# Patient Record
Sex: Female | Born: 1965 | Race: White | Hispanic: Yes
Health system: Western US, Academic
[De-identification: ages and names within clinical notes are randomized; demographics above are authoritative.]

---

## 2020-02-04 ENCOUNTER — Encounter (INDEPENDENT_AMBULATORY_CARE_PROVIDER_SITE_OTHER): Payer: 59 | Admitting: Optometrist

## 2020-06-01 ENCOUNTER — Ambulatory Visit: Payer: 59 | Attending: "Endocrinology | Admitting: "Endocrinology

## 2020-06-01 ENCOUNTER — Encounter (HOSPITAL_BASED_OUTPATIENT_CLINIC_OR_DEPARTMENT_OTHER): Payer: Self-pay | Admitting: "Endocrinology

## 2020-06-01 VITALS — BP 152/76 | HR 75 | Temp 97.2°F | Wt 126.8 lb

## 2020-06-01 DIAGNOSIS — N3090 Cystitis, unspecified without hematuria: Secondary | ICD-10-CM | POA: Insufficient documentation

## 2020-06-01 DIAGNOSIS — E1169 Type 2 diabetes mellitus with other specified complication: Secondary | ICD-10-CM | POA: Insufficient documentation

## 2020-06-01 DIAGNOSIS — E119 Type 2 diabetes mellitus without complications: Secondary | ICD-10-CM | POA: Insufficient documentation

## 2020-06-01 DIAGNOSIS — I10 Essential (primary) hypertension: Secondary | ICD-10-CM | POA: Insufficient documentation

## 2020-06-01 DIAGNOSIS — Z794 Long term (current) use of insulin: Secondary | ICD-10-CM | POA: Insufficient documentation

## 2020-06-01 LAB — GLUCOSE POC, HMC: Glucose (POC): 294 mg/dL — ABNORMAL HIGH (ref 62–125)

## 2020-06-01 MED ORDER — HUMULIN 70/30 KWIKPEN (70-30) 100 UNIT/ML SC SUPN
15.0000 [IU] | PEN_INJECTOR | Freq: Two times a day (BID) | SUBCUTANEOUS | 3 refills | Status: AC
Start: 2020-06-01 — End: ?

## 2020-06-01 MED ORDER — GLUCOSE BLOOD VI STRP
1.0000 | ORAL_STRIP | Freq: Two times a day (BID) | 3 refills | Status: AC
Start: 2020-06-01 — End: ?

## 2020-06-01 NOTE — Progress Notes (Signed)
DIABETES NEW PATIENT ASSESSMENT    HPI: Elizabeth Elliott is a 54 year old female with Type 2 diabetes mellitus. She is referred by Johnell Comings, PA-C for evaluation and management of diabetes. I did receive copies of outside records, which I have reviewed.     Type 2 diabetes mellitus:  Diagnosed in 2010. She has been on insulin for the last 5 years or so. She has been on metformin since the beginning of her diagnosis. She tolerates it well.  After her admission to the hospital in June, she had been doing well. She reports having abdominal pain that has been happening for 1 week. It happens in her lower central abdomen, is intense in nature, lasts for about 15 min at a time and then subsides spontaneously. It is not related to urination or eating. No fever, chills, painful urination, change in color of urine or blood in urine. BMs are regular.  No tingling, numbness, vision changes, nausea, bloating. No chest pain, SOB, palpitations.    Diet:  She eats scrambled eggs, 2 toast for breakfast. Lunch is chicken/meat with 2 tortillas and rice. Dinner is beans, cheese, 2 tortillas. Snacks are usually fruit and are occasional (banana, pear).  Activity:  She needs to be on her feet a lot for work. She does not walk or do any activity outside of work except for 1-2 times per week.  Weight:  She feels she has lost ~10 lbs over the last 8 months. She was 135 lbs in Jan 2021.    Daily routine (7 days a week): Wakes up at 2.30 am. Has coffee at 3 am. Has breakfast at 8 am. She works from 4 am to 11.30 am. She then has lunch at home. Sleeps from 2-4 pm. Then eats dinner. Then sleeps from 8 pm to 2.30 am.     Current medication regimen includes:  Metformin 1 g BID  Detemir 10 units BID - more likely to forget this  Novolog TID QAC as correction only 2:50>150  She used to be on a cholesterol lowering (simvastatin ?dose) and BP lowering medication (thinks it was lisinopril 10 mg daily) but she has been off these for the  last month.    Previous treatments have included: none. She has not met with a nutritionist in the past. Diabetes complications include peripheral neuropathy.  She does not have hypoglycemia unawareness.     Other issues include:  - HTN  - Prior sepsis from ESBL cystitis  - Prior frequent UTIs    Complete ROS negative other than as in HPI above.    Blood glucose meter data:   No meter today. CBG in clinic 294.    Diabetes Prevention:  Renal  is not on ACE/ARB  Ophthalmologic  Last eye exam 5 months ago, ?normal  CV  is not on statin, is not on aspirin  Podiatry  Last foot exam today as below    ALLERGIES:  Reviewed and confirmed    MEDICATIONS:  See above    PAST MEDICAL HISTORY:  Patient Active Problem List   Diagnosis    Type 2 diabetes mellitus without complication, with long-term current use of insulin    Essential hypertension    Cystitis       FAMILY HISTORY:     Family History     Problem (# of Occurrences) Relation (Name,Age of Onset)    Cancer (1) Brother: Colon       Negative family history of: Diabetes, Heart  Disease, Hypertension, Stroke        SOCIAL HISTORY:  Social History     Socioeconomic History    Marital status: Not on file     Spouse name: Not on file    Number of children: Not on file    Years of education: Not on file    Highest education level: Not on file   Occupational History    Not on file   Social Needs    Financial resource strain: Not on file    Food insecurity     Worry: Not on file     Inability: Not on file    Transportation needs     Medical: Not on file     Non-medical: Not on file   Tobacco Use    Smoking status: Never Smoker   Substance and Sexual Activity    Alcohol use: Never     Frequency: Never    Drug use: Never    Sexual activity: Not on file   Lifestyle    Physical activity     Days per week: Not on file     Minutes per session: Not on file    Stress: Not on file   Relationships    Social connections     Talks on phone: Not on file     Gets together: Not  on file     Attends religious service: Not on file     Active member of club or organization: Not on file     Attends meetings of clubs or organizations: Not on file     Relationship status: Not on file    Intimate partner violence     Fear of current or ex partner: Not on file     Emotionally abused: Not on file     Physically abused: Not on file     Forced sexual activity: Not on file   Other Topics Concern    Not on file   Social History Narrative    Moved here from [place] in 06/2019. Works in a Copywriter, advertising. Lives in [place] with partner. She has 2 children who live in [place].     OBGYN:  Menopause at age 49 naturally. Has 2 children.    EXAM:  VS: BP (!) 152/76    Pulse 75    Temp 36.2 C (Temporal)    Wt 57.5 kg (126 lb 12.8 oz)    SpO2 97%   GEN: pleasant, cooperative in no apparent distress  EYES: normal extra-ocular movements, no lid lag, no stare  ENMT: normal thyroid exam  RESP: breathing unlabored  CVS:  rate: normal , rhythm: regular , pedal pulses 2+, peripheral edema: absent   ABDO: mild central obesity  CNS: normal gait  SKIN: No dryness, injection sites without redness or induration, no lipohypertrophy   PSYCH:  normal affect, AOx3  FOOT EXAM: normal, with intact sensation to 10 g monofilament and vibration, no ulcers or lesions, nails with onychomycosis    DATA REVIEW:              ASSESSMENT/PLAN:  Type 2 diabetes:  Elizabeth Elliott has had poorly controlled type 2 DM for a long time. The labs visible to me suggest A1c >10% for all of 2021. She also has signs of relative insulin deficiency with her weight loss and even ketoacidosis recently. Therefore, at this point, we really need to get her glucose down with insulin therapy. Given her main issue has been compliance  with multiple insulin injections per day, that is the first area we targeted today. Given her rigorous daily schedule, we discussed that simplifying her regimen to improve adherence. Her meals are consistent and so it would be  better to use NPH/Lispro 70/30 mixed insulin first, since this will provide both basal and prandial coverage and will be an easier regimen for her to adhere to at 2 doses a day. We discussed the importance of checking BG regularly and bringing meter to her future appointments. Down the road, when she is better controlled, we can consider adding Liraglutide to her regimen (likely only GLP-1Ra covered by her health plan). I would wait to add SGLT-2i (if covered by her insurance) till I can see stability without UTIs.  - stop levemir and lispro insulins  - start NPH 70/30 mixed insulin 15 units BID (before breakfast and before dinner)  - check BG 2 times daily (before breakfast and before dinner)  - bring meter to next appointment    Patient was advised it is appropriate to adjust insulin dosing by 30% as needed to maintain glycemic control.    Risk for NAFLD:  NFS: -0.461  FIB-4: 0.841  BARD: 3  Abdominal imaging: normal appearance of liver on CT in 02/2020  Will monitor with annual CMP and CBC.    Abdominal pain:  Unclear etiology. Not concerning at this time for UTI given absence of dysuria, fever, chills. Will revisit at next visit and if still persistent, will consider UA.    Spent 60 minutes in patient care including time spent with patient discussing issues outlined above, care coordination, data/chart review and documentation.    F/u in 2 weeks.

## 2020-06-14 NOTE — Progress Notes (Signed)
[facility] Follow Up Note    HPI:   Elizabeth Elliott is a 54 year old female with Type 2 diabetes mellitus; she is here today for follow up. Diabetes was diagnosed in 2010.    She reports good adherence to her insulin since the last visit. She reports no symptoms of shaking or sweating or feeling like a low glucose. However, she feels a headache when her BG is down to 120.  She still eats 3 meals a day. Not skipping any meals.  No tingling, numbness, vision changes, nausea, bloating, abdominal pain, diarrhea, conspitation. No chest pain, SOB, palpitations.    In the last week (Fri-Sun), she had symptoms of chills, feeling cold and body ache. She also felt feverish but didn't check temp. No sore throat. No nasal congestion or runny nose. No loss of taste or sense of smell. No contact with anyone with COVID-19. She did not get tested. She is full vaccinated against COVID. She felt fine Monday onwards. She never had issues with dysuria during this.     Current medication regimen includes:  Metformin 1 g BID - but only taking QHS  NPH 70/30 15 units BID  She used to be on a cholesterol lowering (simvastatin ?dose) and BP lowering medication (thinks it was lisinopril 10 mg daily) but she has been off these for the last month.    Previous treatments have included: none. She has not met with a nutritionist in the past. Diabetes complications include peripheral neuropathy.  She does not have hypoglycemia unawareness.     Other issues include:  - HTN  - Prior sepsis from ESBL cystitis  - Prior frequent UTIs  - Risk for NAFLD: NFS: -0.461 (indeterminate), FIB-4: 0.841 (low risk), BARD: 3; Abdominal imaging: normal appearance of liver on CT in 02/2020. Will monitor with annual CMP and CBC.    Complete ROS is negative other than as in HPI above. Past, family and social history were reviewed and updated as needed.    Blood glucose meter data:   Average 257  Checks capillary blood glucose (CBG) readings 1 per  day.  Download interpretation:   Fasting range: 119-319  Pre-dinner range: 213-386    Diabetes Prevention:  Renal  is not on ACE/ARB  Ophthalmologic  Last eye exam 5 months ago, ?normal  CV  is not on statin, is not on aspirin  Podiatry  Last foot exam today 05/2020    PROBLEM LIST:  Patient Active Problem List   Diagnosis   . Type 2 diabetes mellitus without complication, with long-term current use of insulin   . Essential hypertension   . Cystitis       ALLERGIES:  Review of patient's allergies indicates:  No Known Allergies    MEDICATIONS:  Current Outpatient Medications   Medication Sig Dispense Refill   . blood glucose test strip 1 strip 2 times a day. Use to check blood sugar. 100 strip 3   . insulin ASPART 100 UNIT/ML pen-injector Inject under the skin. Inject TID QAC as 2:50>150     . insulin DETEMIR 100 UNIT/ML pen-injector Inject 10 Units under the skin 2 times a day.     . insulin NPH & REGULAR (HumuLIN 70/30 KwikPen) (70-30) 100 UNIT/ML pen-injector Inject 15 Units under the skin 2 times a day before meals. 3 each 3   . metFORMIN 1000 MG tablet Take 1 tablet by mouth 2 times a day.       No current facility-administered  medications for this visit.        EXAM:  Vitals:    06/15/20 1125   Temp: 36.4 C   Pulse: 80   BP: (!) 142/77   SpO2: 98%   Weight: 58.1 kg (128 lb 1.6 oz)     GEN: pleasant, cooperative in no apparent distress  Skin: no lipohypertrophy  Neuro: AAOX3    DATA REVIEW:  None since last visit    ASSESSMENT/PLAN:  Type 2 diabetes:  [name] has been very compliant with her insulin though had been taking only 1 tab of metformin daily. I recommended that she take the metformin 2 times daily instead. In terms of her insulin, it appears that her fasting glucose are reasonable on many days. Therefore, the 15 units at dinner is likely working well. However, the pre-dinner BG is usually very high. Therefore, we discussed increasing her AM NPH 70/30 dose today. She is up to date with measures.  -  increase NPH 70/30 to 25 units in AM and keep at 15 units with dinner  - take metformin 1 g BID as prescribed  - return to see Pharm D in 2 weeks    Fever/chills:  She reports having some feverish symptoms and chills over this past weekend. No symptoms of dysuria but given her prior history, it would be worthwhile to check UA now. She also had no exposure to COVID, but still worthwhile to rule this out, to determine need to isolate. She is no longer symptomatic today and hasn't had symptoms for 3 days now but had symptoms within the last week.  - check COVID PCR  - check UA    Spent 30 minutes in patient care including time spent with patient discussing issues outlined above, care coordination, data/chart review and documentation.    F/u in 3 months.

## 2020-06-15 ENCOUNTER — Other Ambulatory Visit (HOSPITAL_BASED_OUTPATIENT_CLINIC_OR_DEPARTMENT_OTHER): Payer: Self-pay | Admitting: "Endocrinology

## 2020-06-15 ENCOUNTER — Encounter (HOSPITAL_BASED_OUTPATIENT_CLINIC_OR_DEPARTMENT_OTHER): Payer: Self-pay | Admitting: "Endocrinology

## 2020-06-15 ENCOUNTER — Ambulatory Visit: Payer: 59 | Attending: "Endocrinology | Admitting: "Endocrinology

## 2020-06-15 VITALS — BP 142/77 | HR 80 | Temp 97.5°F | Wt 128.1 lb

## 2020-06-15 DIAGNOSIS — N390 Urinary tract infection, site not specified: Secondary | ICD-10-CM

## 2020-06-15 DIAGNOSIS — R509 Fever, unspecified: Secondary | ICD-10-CM | POA: Insufficient documentation

## 2020-06-15 DIAGNOSIS — Z794 Long term (current) use of insulin: Secondary | ICD-10-CM

## 2020-06-15 DIAGNOSIS — E119 Type 2 diabetes mellitus without complications: Secondary | ICD-10-CM

## 2020-06-15 LAB — URINALYSIS WITH REFLEX CULTURE
Bilirubin (Qual), URN: NEGATIVE
Epith Cells_Renal/Trans,URN: NEGATIVE /HPF
Ketones, URN: NEGATIVE mg/dL
Leukocyte Esterase, URN: POSITIVE — AB
Nitrite, URN: NEGATIVE
Specific Gravity, URN: 1.022 g/mL (ref 1.006–1.027)
pH, URN: 6 (ref 5.0–8.0)

## 2020-06-15 LAB — REFLEX CULTURE FOR UA

## 2020-06-15 NOTE — Patient Instructions (Signed)
Aumentar la insulina a 25 unidades por la Diannia Ruder y mantener 15 unidades por la noche.

## 2020-06-19 LAB — URINE C/S
Culture: 11000 — AB
Culture: >100000 — AB

## 2020-06-21 ENCOUNTER — Telehealth (HOSPITAL_BASED_OUTPATIENT_CLINIC_OR_DEPARTMENT_OTHER): Payer: Self-pay | Admitting: Nursing

## 2020-06-21 MED ORDER — SULFAMETHOXAZOLE-TRIMETHOPRIM 800-160 MG OR TABS
1.0000 | ORAL_TABLET | Freq: Two times a day (BID) | ORAL | 0 refills | Status: AC
Start: 2020-06-21 — End: 2020-06-24

## 2020-06-21 NOTE — Progress Notes (Signed)
Sent prescription of bactrim DS 1 tab BID for 3 days to [facility].

## 2020-06-21 NOTE — Telephone Encounter (Signed)
Left a message for the pt via spanish interpreter relaying the provider message about the course of antibiotics.

## 2020-06-21 NOTE — Telephone Encounter (Signed)
-----   Message from Cristine Polio, MD sent at 06/19/2020  9:59 PM PDT -----  Efraim Kaufmann,    I want to prescribe her bactrim 1 DS tablet twice daily for 3 days. She has a UTI and her cutlure suggests the organism is susceptible to this.  She reported to me that her symptoms had mostly resolved but given her prior history of UTIs/ sepsis recently, and current CFU, I want to treat. If her symptoms are actually worse now, then I think she should take it for 14 days and also reach out to her PCP. Can you call her Monday?    Thanks!  Arthi  ----- Message -----  From: Christain [place] Results Interface, User  Sent: 06/15/2020   1:12 PM PDT  To: Cristine Polio, MD

## 2020-06-22 ENCOUNTER — Telehealth (HOSPITAL_BASED_OUTPATIENT_CLINIC_OR_DEPARTMENT_OTHER): Payer: Self-pay | Admitting: Nursing

## 2020-06-22 NOTE — Telephone Encounter (Addendum)
Called and relayed the message to the pt. She will pick up the Rx today.     ----- Message from Cristine Polio, MD sent at 06/21/2020  1:04 PM PDT -----  Sent it over. Can you try to reach her once tomorrow?    Thanks!  Arthi  ----- Message -----  From: Edger House., RN  Sent: 06/21/2020  12:36 PM PDT  To: Cristine Polio, MD    Arthi,     We were not able to reach the pt. Did you want to send a prescription for the antibiotic to the pharmacy.     Thanks,   Leam Madero   ----- Message -----  From: Cristine Polio, MD  Sent: 06/19/2020   9:59 PM PDT  To: Edger House, RN    Aston Lieske,    I want to prescribe her bactrim 1 DS tablet twice daily for 3 days. She has a UTI and her cutlure suggests the organism is susceptible to this.  She reported to me that her symptoms had mostly resolved but given her prior history of UTIs/ sepsis recently, and current CFU, I want to treat. If her symptoms are actually worse now, then I think she should take it for 14 days and also reach out to her PCP. Can you call her Monday?    Thanks!  Arthi  ----- Message -----  From: Christain Sacramento Results Interface, User  Sent: 06/15/2020   1:12 PM PDT  To: Cristine Polio, MD

## 2020-07-06 ENCOUNTER — Ambulatory Visit (HOSPITAL_BASED_OUTPATIENT_CLINIC_OR_DEPARTMENT_OTHER): Payer: 59

## 2020-07-22 ENCOUNTER — Other Ambulatory Visit (HOSPITAL_BASED_OUTPATIENT_CLINIC_OR_DEPARTMENT_OTHER): Payer: Self-pay | Admitting: Unknown Physician Specialty

## 2020-07-22 DIAGNOSIS — E119 Type 2 diabetes mellitus without complications: Secondary | ICD-10-CM

## 2020-07-22 DIAGNOSIS — Z794 Long term (current) use of insulin: Secondary | ICD-10-CM

## 2020-08-03 ENCOUNTER — Encounter (HOSPITAL_BASED_OUTPATIENT_CLINIC_OR_DEPARTMENT_OTHER): Payer: 59

## 2020-08-03 NOTE — Progress Notes (Deleted)
Referring Provider: Cristine Polio, MD   Referral Date: 06/15/20  Interpreter Services: A  Spanish speaking interpreter was present for the visit  by telephone.     SUBJECTIVE  CC: Elizabeth Elliott is a 54 year old year old female seeing the pharmacist for follow-up of type 2 diabetes.     Diabetes History: Type 2 diabetes diagnosed 2010. Started seeing [facility] (Dr. Rogers Seeds) on 06/01/20.  Complications: peripheral neuropathy  Pertinent PMH: HTN, prior h/o frequent UTIs and prior sepsis from ESBL cystitis    Interval History: Last seen by Dr. Rogers Seeds 6 weeks ago after changing from basal-bolus to premixed insulin (Humulin N 70/30). Her AM dose of 70/30 insulin was increased.     Today, patient reports ***    *** s/sx of hypoglycemia (dizziness, sweating, shakiness)  *** s/sx of hyperglycemia (polyuria, polydipsia, fatigue, blurry vision, peripheral neuropathy)    Current diabetes regimen:   Metformin 1000 mg BID  Humulin N 70/30 (pen) 25 units with breakfast and 15 units with dinner     Adherence: ***    Lifestyle:  Daily routine (7 days a week): Wakes up at 2.30 am. Has coffee at 3 am. Has breakfast at 8 am. She works from 4 am to 11.30 am. She then has lunch at home. Sleeps from 2-4 pm. Then eats dinner. Then sleeps from 8 pm to 2.30 am.     Diet:***   Breakfast: scrambled eggs, 2 toast   Lunch: chicken/meat with 2 tortillas and rice.    Dinner: beans, cheese, 2 tortillas. Snacks are usually fruit and are occasional (banana, pear).  -Exercise:  active/walking a lot during work, no exercise otherise  -Other: ***    OBJECTIVE  Diabetes Information:  Home Glucometer Readings: {;:106524}    There were no vitals filed for this visit.      BP Readings from Last 3 Encounters:   06/15/20 (!) 142/77   06/01/20 (!) 152/76       Wt Readings from Last 5 Encounters:   06/15/20 58.1 kg (128 lb 1.6 oz)   06/01/20 57.5 kg (126 lb 12.8 oz)       Labs:            ASSESSMENT/PLAN  There are no diagnoses linked to this  encounter.    Follow up: ***    EDUCATION  -Primary learner(s): {;:106526}  -Challenges impacting this teaching session: {;:017494}  -Education materials provided: ***  -Post education response: {;:106530}    Medication  Medication education discussion topics included: {;:106535}    Diabetes  Diabetes discussion topics included:{;:106531}  Glucometer education: {;:496759}    Nutrition  Nutritional discussion topics included: {;:106533}    Exercise  Exercise discussion topics included: {;:106534}    MEDICATIONS AT END OF VISIT  Current Outpatient Medications   Medication Sig Dispense Refill    blood glucose test strip 1 strip 2 times a day. Use to check blood sugar. 100 strip 3    insulin NPH & REGULAR (HumuLIN 70/30 KwikPen) (70-30) 100 UNIT/ML pen-injector Inject 15 Units under the skin 2 times a day before meals. 3 each 3    metFORMIN 1000 MG tablet Take 1 tablet by mouth 2 times a day.       No current facility-administered medications for this visit.        TIME SPENT  9:52 AM   {Please include time spent statement - .totaltimeoffice}    Wadie Lessen, PharmD

## 2020-09-13 ENCOUNTER — Emergency Department: Payer: Self-pay

## 2020-09-14 ENCOUNTER — Inpatient Hospital Stay: Payer: Self-pay

## 2020-09-27 ENCOUNTER — Telehealth (HOSPITAL_BASED_OUTPATIENT_CLINIC_OR_DEPARTMENT_OTHER): Payer: Self-pay | Admitting: Ophthalmology

## 2020-09-27 NOTE — Telephone Encounter (Signed)
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    Thank you

## 2020-09-27 NOTE — Progress Notes (Deleted)
[facility] Follow Up Note    HPI:   Elizabeth Elliott is a 54 year old female with Type 2 diabetes mellitus; she is here today for follow up. Diabetes was diagnosed in 2010.    Current medication regimen includes:  Metformin 1 g BID   NPH 70/30 25 units QAM and 15 units QPM  She used to be on a cholesterol lowering (simvastatin ?dose) and BP lowering medication (thinks it was lisinopril 10 mg daily) but she has been off these for the last month.    Previous treatments have included:none. Shehas notmet with a nutritionist in the past. Diabetes complications include peripheral neuropathy. She does nothave hypoglycemia unawareness.     Other issues include:  - HTN  - Prior sepsis from ESBL cystitis  - Prior frequent UTIs  - Risk for NAFLD: NFS:-0.461 (indeterminate), FIB-4:0.841 (low risk), BARD:3; Abdominal imaging:normal appearance of liver on CT in 02/2020. Will monitor with annual CMP and CBC.    Complete ROS is negative other than as in HPI above. Past, family and social history were reviewed and updated as needed.    Blood glucose meter data:   Average ***, SD ***  Checks capillary blood glucose (CBG) readings *** per day.  Download interpretation: ***    Diabetes Prevention:  Renalis noton ACE/ARB  OphthalmologicLast eye exam5 months ago, ?normal  CVis noton statin, is noton aspirin  PodiatryLast foot examtoday 05/2020    PROBLEM LIST:  Patient Active Problem List   Diagnosis    Type 2 diabetes mellitus without complication, with long-term current use of insulin    Essential hypertension    Cystitis       ALLERGIES:  Review of patient's allergies indicates:  No Known Allergies    MEDICATIONS:  Current Outpatient Medications   Medication Sig Dispense Refill    blood glucose test strip 1 strip 2 times a day. Use to check blood sugar. 100 strip 3    insulin NPH & REGULAR (HumuLIN 70/30 KwikPen) (70-30) 100 UNIT/ML pen-injector Inject 15 Units under the skin 2 times a day  before meals. 3 each 3    metFORMIN 1000 MG tablet Take 1 tablet by mouth 2 times a day.       No current facility-administered medications for this visit.       EXAM:  VS: There were no vitals taken for this visit.  GEN: pleasant, cooperative in no apparent distress  EYES: normal extra-ocular movements, no lid lag, no stare  ENMT: mucous membranes moist, normal dentition  RESP: No wheeze, breathing unlabored  CVS:  rate: normal , rhythm: regular , pedal pulses 2+, peripheral edema: absent   ABDO: {Yes No (Default) :109033::"No"} central obesity  CNS: normal gait  SKIN: No dryness, injection sites without redness or induration, no lipohypertrophy   PSYCH:  normal affect, AOx3  FOOT EXAM: {DIABETIC FOOT EXAM:100689::"normal, with intact sensation to 10 g monofilament and vibration, no ulcers or lesions"}, nails {NORMAL/OTHER:103686::"with onychomycosis"}    DATA REVIEW:    ASSESSMENT/PLAN:      Spent *** minutes in patient care including time spent with patient discussing issues outlined above, care coordination, data/chart review and documentation.    F/u in *** months.

## 2020-09-28 ENCOUNTER — Encounter (HOSPITAL_BASED_OUTPATIENT_CLINIC_OR_DEPARTMENT_OTHER): Payer: 59 | Admitting: "Endocrinology

## 2021-02-05 ENCOUNTER — Emergency Department: Payer: Self-pay

## 2021-03-07 ENCOUNTER — Encounter (HOSPITAL_BASED_OUTPATIENT_CLINIC_OR_DEPARTMENT_OTHER): Payer: Self-pay | Admitting: Infectious Disease

## 2021-03-07 ENCOUNTER — Ambulatory Visit: Payer: 59 | Attending: Internal Medicine | Admitting: Infectious Disease

## 2021-03-07 VITALS — BP 155/45 | HR 66 | Temp 97.7°F | Resp 18 | Wt 127.0 lb

## 2021-03-07 DIAGNOSIS — R310 Gross hematuria: Secondary | ICD-10-CM | POA: Insufficient documentation

## 2021-03-07 DIAGNOSIS — N390 Urinary tract infection, site not specified: Secondary | ICD-10-CM | POA: Insufficient documentation

## 2021-03-07 MED ORDER — PREMARIN 0.625 MG/GM VA CREA
0.5000 g | TOPICAL_CREAM | VAGINAL | 1 refills | Status: AC
Start: 2021-03-09 — End: ?

## 2021-03-07 NOTE — Progress Notes (Signed)
INFECTIOUS DISEASE CLINIC   INITIAL VISIT  03/07/2021     REFERRING PROVIDER : Leta Speller, PA-C    ID/CC: Elizabeth Elliott is a 55 year old female with a history of T2DM who presents today for recurrent ESBL E coli UTI.    HISTORY OF PRESENT ILLNESS:    Patient has a history of ESBL E coli UTI and bacteremia over the past year. She had a previous hospitalization in 08/2020 for pyelonephritis secondary to ESBL E coli. More recently, she presented to OSH ED on 02/05/2021 with a 2 week history of intermittent left flank pain radiating to her entire abdomen. She also reported dysuria x 1 week and nausea. Labs notable for WBC 11.4. UA with 2+ LE, 70 WBC. Urine culture grew ESBL E coli. CT scan showed evidence of pyelonephritis. She was prescribed a 10 day course of PO bactrim DS single tablet daily, though patient reports taking 2 tablets daily, one in the morning and one in the evening.    Patient was referred to ID clinic for her history of recurrent UTI. Today, she denies any dysuria, abdominal pain, or flank pain. She does endorse intermittent hematuria starting 4/26 that lasts for 1-2 days at a time and self-resolves. Patient otherwise does not have any current symptoms. Denies fevers, chills, nausea, vomiting, diarrhea, or rash.      REVIEW OF SYSTEMS:  I performed a complete review of systems today and have documented the patient's positive and pertinent negative complaints above. All remaining systems are negative.      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  From [place]moved over 20 years ago). Used to live in [place], moved to [place] 2 years ago.   No history of tuberculosis, no known exposures  Sexually active  Post-menopausal    FAMILY HISTORY:  No family history of recurrent infections    PHYSICAL EXAMINATION:    Vitals: Blood pressure (!) 155/45, pulse 66, temperature 36.5 C, temperature source Temporal, resp. rate 18, weight 57.6 kg (127 lb).   Gen: Comfortable, in NAD.  Eyes: eyelids normal, conjunctiva clear, pupils equal.  No scleral icterus.  ENMT: normal dentition, oropharynx normal, moist mucous membranes  Lungs: CTAB, nl resp effort. No wheezes/crackles/rhonchi  Heart: CV regular rate and rhythm. No murmurs.   Abd: +BS, NT, ND  MSK: No edema. No swollen joints. Normal range of motion in extremities   Skin: No rashes, warm and dry.   Neuro: A&Ox3, CN II-XII intact, no focal deficits  Psych: normal mood, appropriate affect  Lines: none        LABORATORY DATA:  I have reviewed the following lab data:  Lab Results   Component Value Date    GLUCOSE 294 (H) 06/01/2020       MICROBIOLOGY:  I have reviewed the following micro data:    OSH  02/05/2021 Urine culture >100,000 E coli ESBL  Organism Antibiotic Susceptibility   **Escherichia coli Positive for ESBL** Ampicillin >=32: Resistant   **Escherichia coli Positive for ESBL** Ampicillin + Sulbactam >=32: Resistant   **Escherichia coli Positive for ESBL** Cefazolin >=64: Resistant   **Escherichia coli Positive for ESBL** Ciprofloxacin >=4: Resistant   **Escherichia coli Positive for ESBL** ESBL Positive   **Escherichia coli Positive for ESBL** Gentamicin >=  16: Resistant   **Escherichia coli Positive for ESBL** Levofloxacin >=8: Resistant   **Escherichia coli Positive for ESBL** Meropenem <=0.25: Sensitive   **Escherichia coli Positive for ESBL** Nitrofurantoin <=16: Sensitive   **Escherichia coli Positive for ESBL** Tobramycin >=16: Resistant   **Escherichia coli Positive for ESBL** Trimethoprim/Sulfa <=20: Sensitive     09/14/2020 Blood culture E coli ESBL  **Escherichia coli Positive for ESBL** Ampicillin >=32: Resistant   **Escherichia coli Positive for ESBL** Ampicillin + Sulbactam >=32: Resistant   **Escherichia coli Positive for ESBL** Ciprofloxacin >=4: Resistant    **Escherichia coli Positive for ESBL** ESBL Positive   **Escherichia coli Positive for ESBL** Gentamicin >=16: Resistant   **Escherichia coli Positive for ESBL** Levofloxacin >=8: Resistant   **Escherichia coli Positive for ESBL** Meropenem <=0.25: Sensitive   **Escherichia coli Positive for ESBL** Tobramycin >=16: Resistant   **Escherichia coli Positive for ESBL** Trimethoprim/Sulfa <=20: Sensitive     09/14/2020 Urine culture >100,000  E coli ESBL  **Escherichia coli Positive for ESBL** Ampicillin >=32: Resistant   **Escherichia coli Positive for ESBL** Ampicillin + Sulbactam >=32: Resistant   **Escherichia coli Positive for ESBL** Cefazolin >=64: Resistant   **Escherichia coli Positive for ESBL** Ciprofloxacin >=4: Resistant   **Escherichia coli Positive for ESBL** ESBL Positive   **Escherichia coli Positive for ESBL** Gentamicin >=16: Resistant   **Escherichia coli Positive for ESBL** Levofloxacin >=8: Resistant   **Escherichia coli Positive for ESBL** Meropenem <=0.25: Sensitive   **Escherichia coli Positive for ESBL** Nitrofurantoin <=16: Sensitive   **Escherichia coli Positive for ESBL** Tobramycin >=16: Resistant   **Escherichia coli Positive for ESBL** Trimethoprim/Sulfa <=20: Sensitive     03/22/2020 Blood culture E coli ESBL  03/21/2020 Urine culture >100,000 E coli ESBL  **Escherichia coli Positive for ESBL** Ampicillin >=32: Resistant   **Escherichia coli Positive for ESBL** Ampicillin + Sulbactam >=32: Resistant   **Escherichia coli Positive for ESBL** Cefazolin >=64: Resistant   **Escherichia coli Positive for ESBL** Ciprofloxacin >=4: Resistant   **Escherichia coli Positive for ESBL** ESBL Positive   **Escherichia coli Positive for ESBL** Gentamicin >=16: Resistant   **Escherichia coli Positive for ESBL** Levofloxacin >=8: Resistant   **Escherichia coli Positive for ESBL** Meropenem <=0.25: Sensitive   **Escherichia coli Positive for ESBL** Nitrofurantoin <=16: Sensitive   **Escherichia coli Positive  for ESBL** Trimethoprim/Sulfa <=20: Sensitive       11/24/2019 Urine culture 10,000-50,000 E coli ESBL  **Escherichia coli Positive for ESBL** Ampicillin >=32: Resistant   **Escherichia coli Positive for ESBL** Ampicillin + Sulbactam >=32: Resistant   **Escherichia coli Positive for ESBL** Cefazolin >=64: Resistant   **Escherichia coli Positive for ESBL** Ciprofloxacin >=4: Resistant   **Escherichia coli Positive for ESBL** ESBL Positive   **Escherichia coli Positive for ESBL** Gentamicin >=16: Resistant   **Escherichia coli Positive for ESBL** Levofloxacin >=8: Resistant   **Escherichia coli Positive for ESBL** Meropenem <=0.25: Sensitive   **Escherichia coli Positive for ESBL** Nitrofurantoin <=16: Sensitive   **Escherichia coli Positive for ESBL** Piperacillin/Tazobactam <=4: Sensitive   **Escherichia coli Positive for ESBL** Trimethoprim/Sulfa <=20: Sensitive     11/24/2019 Blood culture E coli  Escherichia coli Ampicillin >=32: Resistant   Escherichia coli Ampicillin + Sulbactam >=32: Resistant   Escherichia coli Ceftazidime <=1: Sensitive   Escherichia coli Ceftriaxone <=1: Sensitive   Escherichia coli Ciprofloxacin >=4: Resistant   Escherichia coli ESBL Negative   Escherichia coli Gentamicin >=16: Resistant   Escherichia coli Levofloxacin >=8: Resistant   Escherichia coli Meropenem <=0.25: Sensitive   Escherichia coli Piperacillin/Tazobactam <=4: Sensitive   Escherichia coli  Trimethoprim/Sulfa <=20: Sensitive     11/22/2019 Urine culture E coli ESBL  **Escherichia coli Positive for ESBL** Ampicillin >=32: Resistant   **Escherichia coli Positive for ESBL** Ampicillin + Sulbactam >=32: Resistant   **Escherichia coli Positive for ESBL** Cefazolin >=64: Resistant   **Escherichia coli Positive for ESBL** Ciprofloxacin >=4: Resistant   **Escherichia coli Positive for ESBL** ESBL Positive   **Escherichia coli Positive for ESBL** Gentamicin >=16: Resistant   **Escherichia coli Positive for ESBL** Levofloxacin >=8:  Resistant   **Escherichia coli Positive for ESBL** Meropenem <=0.25: Sensitive   **Escherichia coli Positive for ESBL** Nitrofurantoin <=16: Sensitive   **Escherichia coli Positive for ESBL** Piperacillin/Tazobactam <=4: Sensitive   **Escherichia coli Positive for ESBL** Trimethoprim/Sulfa <=20: Sensitive             [facility]  06/15/2020 Urine culture >100,000 E coli (2 colony types) MDRO  Escherichia coli (1) Escherichia coli (2)       MICROTITER MIC (MCG/ML)-SELECT MICROTITER MIC (MCG/ML)-SELECT     Amikacin <=16  Susceptible <=16  Susceptible     Ampicillin >16  Resistant >16  Resistant     Ampicillin/sulbactam 16  Intermediate. >16  Resistant     Aztreonam 8  Intermediate. >8  Resistant     Cefazolin >16  Resistant >16  Resistant     Cefepime 4  Susceptible-Dose Dependent 4  Susceptible-Dose Dependent     Cefotetan <=16  Susceptible <=16  Susceptible     Ceftazidime 4  Susceptible 8  Intermediate.     Ceftriaxone >2  Resistant >2  Resistant     Ciprofloxacin >2  Resistant >2  Resistant     Doxycycline 4  Susceptible 8  Intermediate.     Ertapenem <=0.12  Susceptible <=0.12  Susceptible     Gentamicin >8  Resistant >8  Resistant     Levofloxacin >4  Resistant >4  Resistant     Meropenem <=1  Susceptible <=1  Susceptible     Moxifloxacin >2  -- >2  --     Nitrofurantoin <=32  Susceptible <=32  Susceptible     Piperacillin/tazobactam <=2  Susceptible 4  Susceptible     Tobramycin >8  Resistant >8  Resistant     Trimeth_Sulfamethoxazole <=2  Susceptible <=2  Susceptible          IMAGING:  I have reviewed the following imaging:    OSH CT abdomen pelvis with contrast 02/05/2021  FINDINGS:  Lower Thorax: Heart size is normal. Lung bases are clear.    Liver: The liver is normal in size and morphology,. No focal lesion.The portal veins are patent.     Biliary System:   Gallbladder: Normal    Bile Ducts: No dilation.     Spleen: No splenomegaly    Pancreas: Normal    Adrenals: Normal.     Urinary System:   Kidneys and Ureters:  There is left pyelonephritis and pyonephrosis. No obstructive stone is identified. No renal or ureteral calculi. No hydronephrosis or hydroureter. Focal scarring in the right lower pole kidney. Nonobstructive 2 mm right lower   pole nephrolith. Bladder: Normal.    GI System: Stomach, small bowel, and large bowel are normal in caliber without wall thickening or dilation. Absent appendix.    Reproductive Organs: Reflux of contrast into the left gonadal vein with dilated periuterine venous plexus.    Vasculature: Aorta and iliac arteries are normal in caliber.    Peritoneal cavity, lymphadenopathy, and surface: No free fluid. No pneumoperitoneum. No lymphadenopathy.  Soft Tissues: There is a cyst within the left labia measuring 20 mm.    Bones: No acute fracture or aggressive osseous lesion        ASSESSMENT/PLAN:  Elizabeth Elliott is a 55 year old female with a history of T2DM who presents today for recurrent ESBL E coli UTI.    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 There may be several risk factors pre-disposing her to recurrent infections, including her poorly-controlled diabetes, which we can try to optimize. Given the resistance profile already exhibited by this bug, we would not recommend starting any prophylactic antibiotics as this will likely contribute to more resistance over time and has limited data showing efficacy. Rather, we will recommend staying hydrated, vaginal estrogen, and consideration of over-the-counter D mannose. We have discussed these recommendations with the patient.     With regards to her hematuria, we would like to refer her to urology for further evaluation and possible cystoscopy.    Recommendations  - Stay hydrated (drink at least 2 liters of water per day)  - Post-coital  urination  - Vaginal estrogen cream, applied twice weekly. Sent to pharmacy  - Over-the-counter D-mannose  - Referral to Urology for gross hematuria  - Would not favor starting prophylactic antibiotics at this time    Call if worsening pain/swelling/redness/drainage at surgical site;  or if fever, chills, sweats, nausea, vomiting, diarrhea (over 6 stools per day), rash, itching, hives, joint pain, tendon pain, muscle pain.     : Phone [phone] Option 2    RTC:   The patient will return to clinic PRN.    The patient was seen and discussed with attending, Dr. Urban Gibson.       Ezzie Dural, MD  Fellow, Infectious Diseases

## 2021-03-08 ENCOUNTER — Telehealth (HOSPITAL_BASED_OUTPATIENT_CLINIC_OR_DEPARTMENT_OTHER): Payer: Self-pay | Admitting: Infectious Disease

## 2021-03-08 NOTE — Telephone Encounter (Signed)
This RN did not fax requested chart notes. Caller's name is Lurena Joiner, however there is no additional information provided. I don't know which clinic this person is calling from. I returned call to Rebecca's number but it repeatedly rang and there was no voicemail. RN will not fax requested PHI to unknown person at unknown clinic and without pt consent.

## 2021-03-08 NOTE — Telephone Encounter (Signed)
RETURN CALL: Voicemail - Detailed Message      SUBJECT:  General Message     MESSAGE: Lurena Joiner from referring clinic is calling to request the chart notes from office visit 03/07/21 to be fax to her since they have her there now       [phone]  fax attention Lurena Joiner

## 2021-03-16 ENCOUNTER — Encounter (HOSPITAL_BASED_OUTPATIENT_CLINIC_OR_DEPARTMENT_OTHER): Payer: Self-pay

## 2021-03-22 NOTE — Telephone Encounter (Signed)
Encounter Error. 

## 2021-08-07 ENCOUNTER — Encounter (HOSPITAL_BASED_OUTPATIENT_CLINIC_OR_DEPARTMENT_OTHER): Payer: 59 | Admitting: Urology

## 2022-08-12 IMAGING — CT CT BRAIN WO/W CONTRAST
3 of 6 series · 13 of 47 positions shown, 15 images · IV contrast (isovue)
Comparison: CT brain 10/01/19

INDICATION: 55-year-old female with tinnitus. Per patient history of meninigitis, shunt placed in 3118. Follow-up previous exam.
TECHNIQUE: Axial CT images of the brain were acquired without and with the administration of IV contrast. The patient received an intravenous dose of 100 mL Isovue 300. I-STAT creatinine 0.7. Multiplanar reformatted images were also obtained. Dose reduction technique used: Automated exposure control and adjustment of the mA and/or kV according to patient size. CT count in previous 12-months: 0

[Series 4: coronal pre · coronal · non-contrast · 0.33mm/px · 3 of 59 slices shown]
[im 15/59  brain]
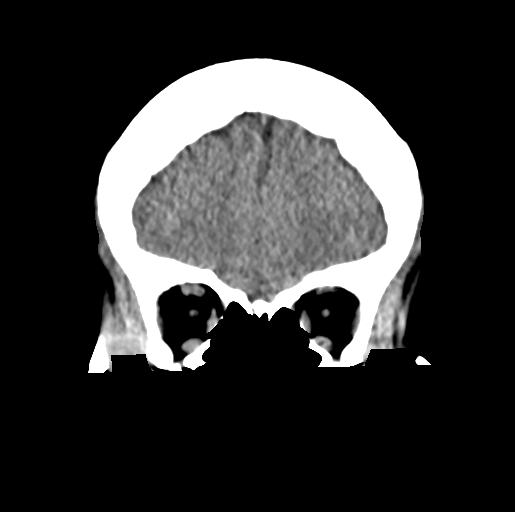
[im 29/59  brain]
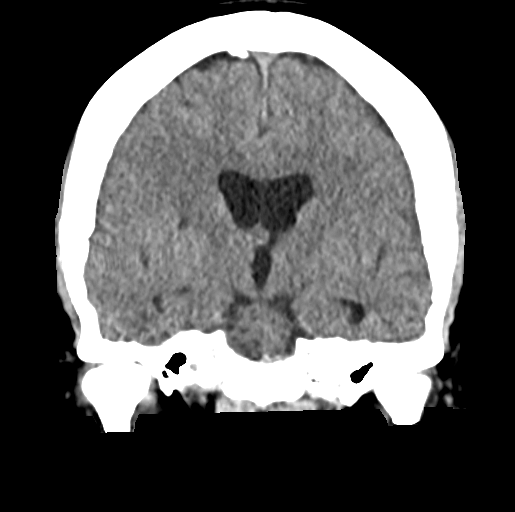
[im 43/59  brain]
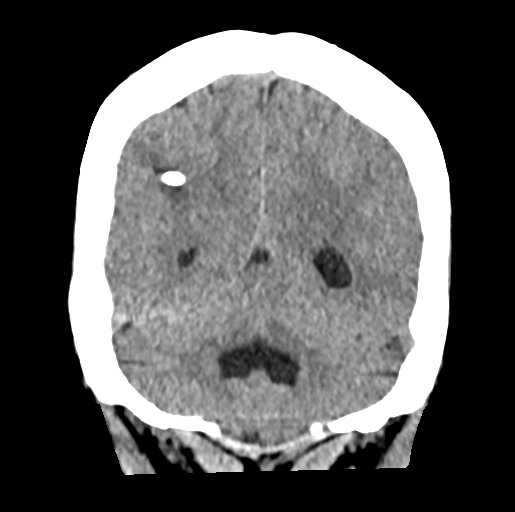

[Series 6: sagittal pre · sagittal · non-contrast · 0.33mm/px · 2 of 56 slices shown]
[im 19/56  brain]
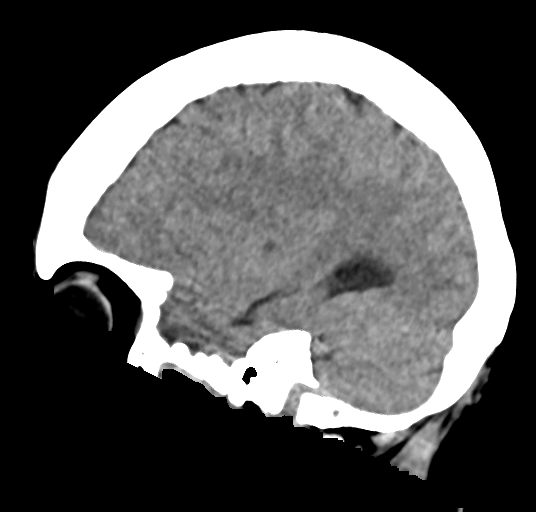
[im 37/56  brain]
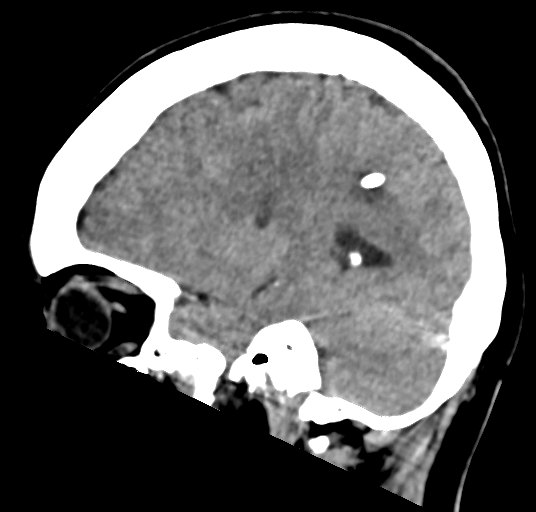

[Series 8: head w soft tissue · axial · 0.32mm/px · z∈[-623,-495]mm · 8 of 57 slices shown, 10 images]
[im 4/57  brain]
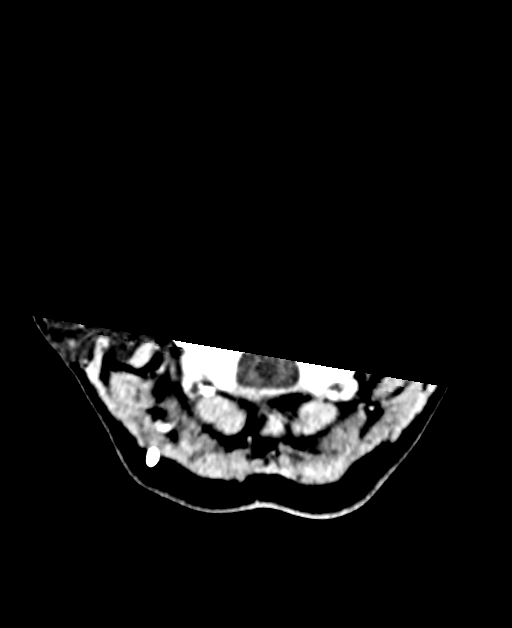
[im 4/57  bone]
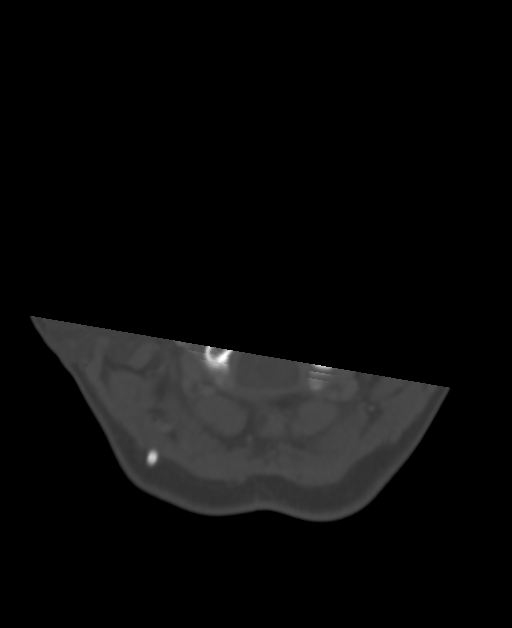
[im 12/57  brain]
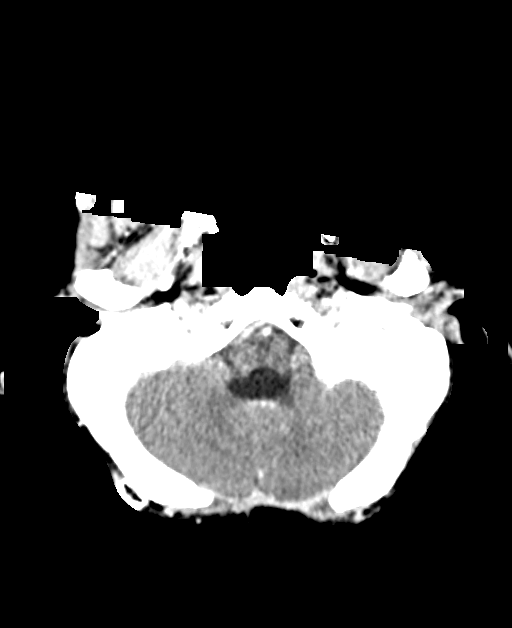
[im 19/57  brain]
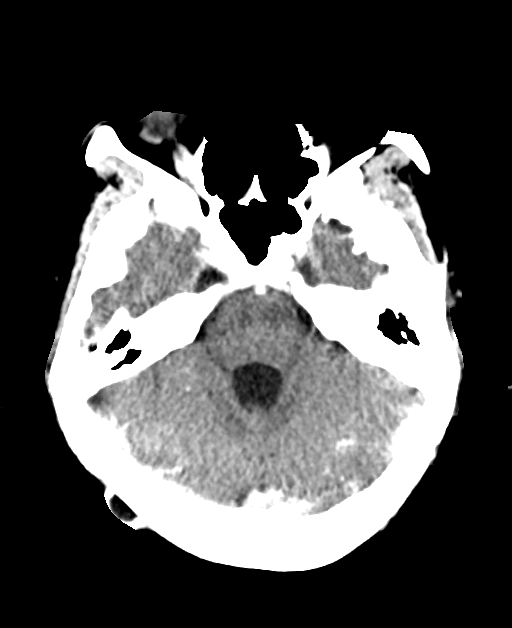
[im 27/57  brain]
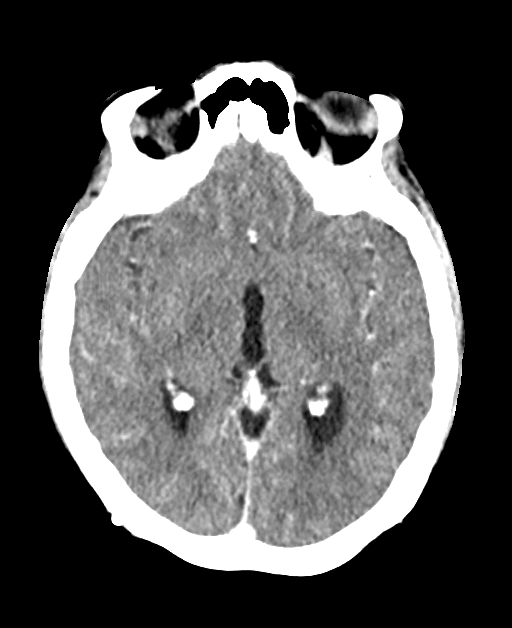
[im 30/57  brain]
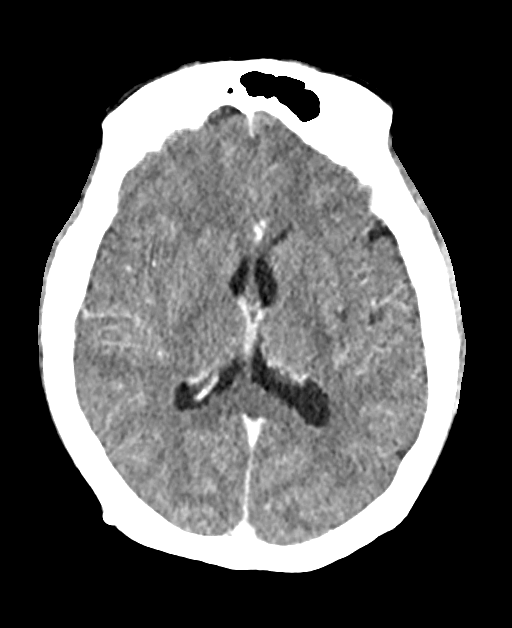
[im 30/57  bone]
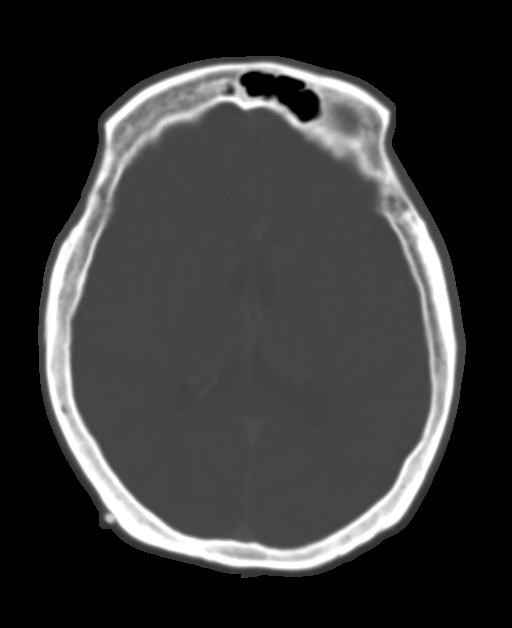
[im 38/57  brain]
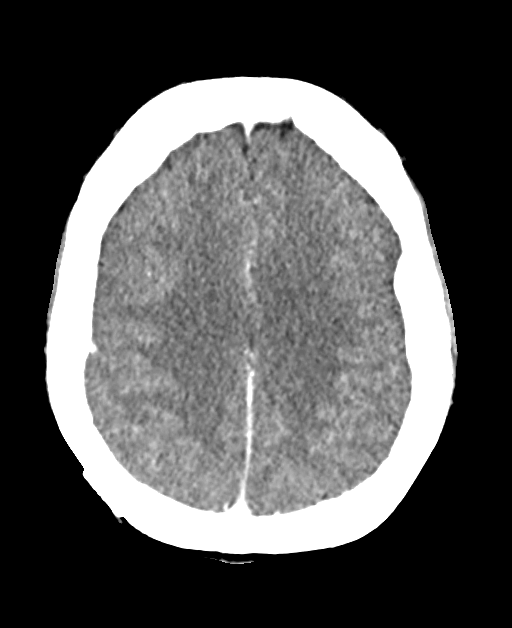
[im 45/57  brain]
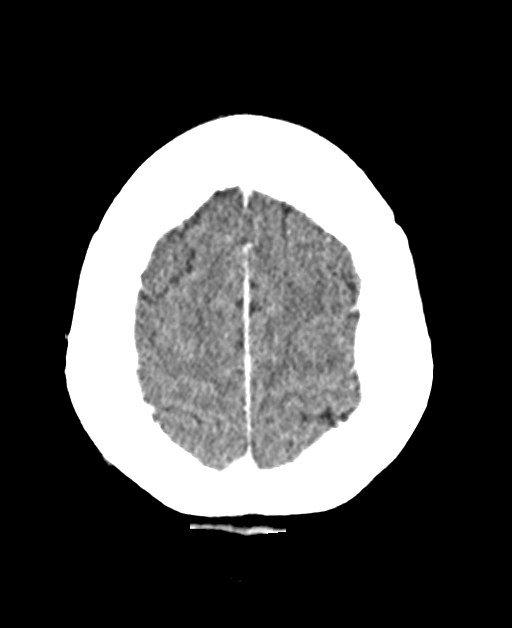
[im 53/57  brain]
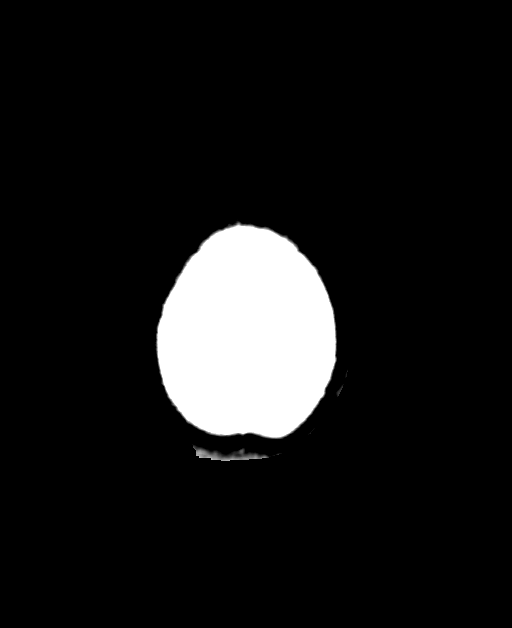

[13 of 47 positions shown; findings below may reference images not displayed]

FINDINGS: Brain parenchyma: No acute infarct or hemorrhage. Redemonstrated small hypodensities in the bilateral basal ganglia and right external capsule. Postsurgical changes from suboccipital craniotomy.

Ventricles: Stable position of right parietal approach ventricular catheter terminating in the left lateral ventricle. Mild increase in size of the lateral, third and fourth ventricles. For example, the lateral ventricles measure 3.1 cm transversely, previously 2.8 cm; third ventricle 0.7 cm, previously 0.5 cm and the fourth ventricle 2.7 cm, previously 2.3 cm. Diffuse effacement of the cerebral sulci similar to the previous exam. No transependymal edema. No midline shift or downward herniation.

Extra-axial spaces: No extra-axial fluid collection.

Extracranial structures: Paranasal sinuses, mastoid air cells and middle ear cavities without significant disease. Calvarium intact. Orbits unremarkable. Soft tissues normal.

Contrast enhanced views are negative for pathologic enhancement.
IMPRESSION: Mild worsening hydrocephalus since previous exam on 10/01/19. No transependymal edema to suggest acute hydrocephalus. Persistent mild diffuse cerebral sulcal effacement. 

Stable position of right parietal approach ventricular catheter.

Total radiation dose to patient is CTDIvol 67.80 mGy and DLP 1122.00 mGy-cm.

ABNORMAL FINDINGS!

## 2023-03-03 IMAGING — CR C-SPINE 4 or 5 views
1 series · 5 of 5 positions shown · non-contrast
Comparison: March 04, 2020

REASON FOR EXAM: Pain

[Series 1: w cervical spine lat · 0.15mm/px · 5 of 5 slices shown]
[im 1/5]
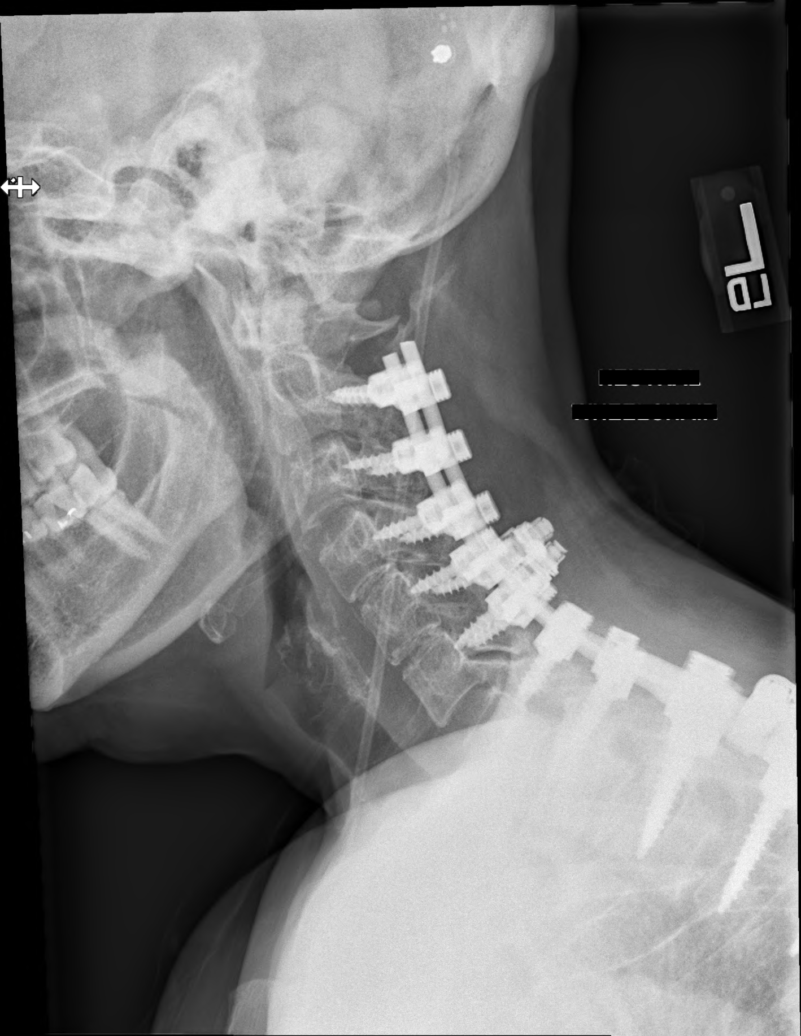
[im 2/5]
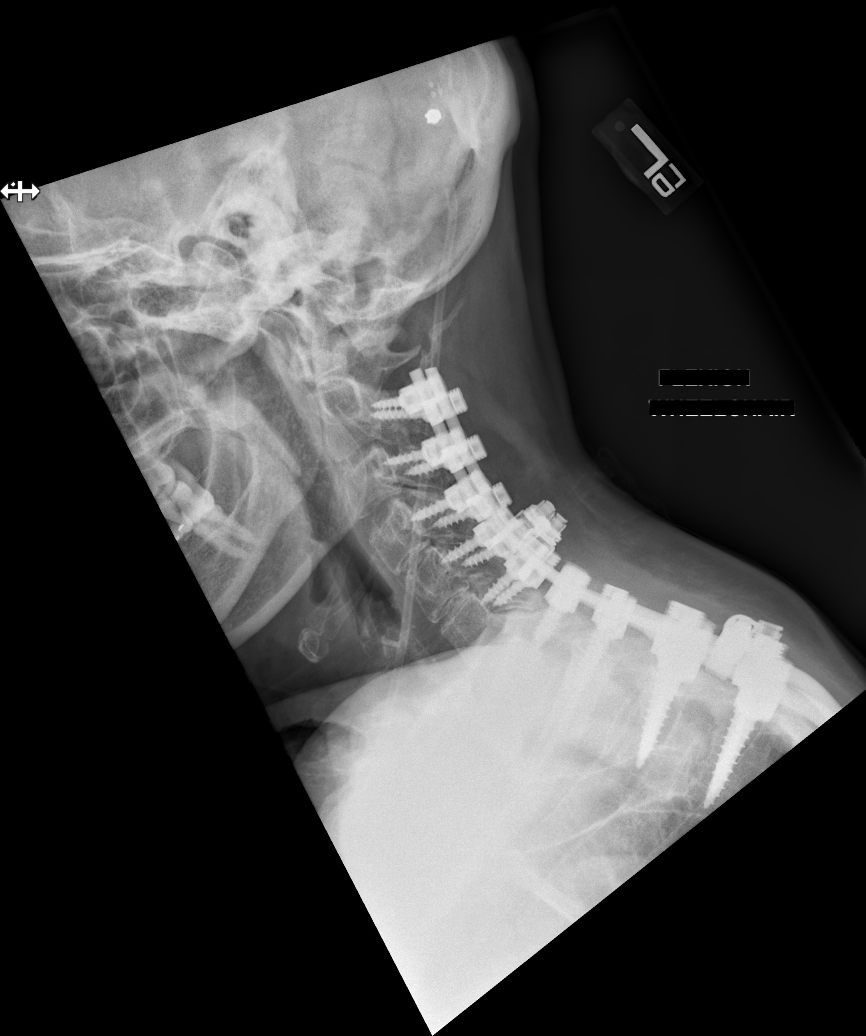
[im 3/5]
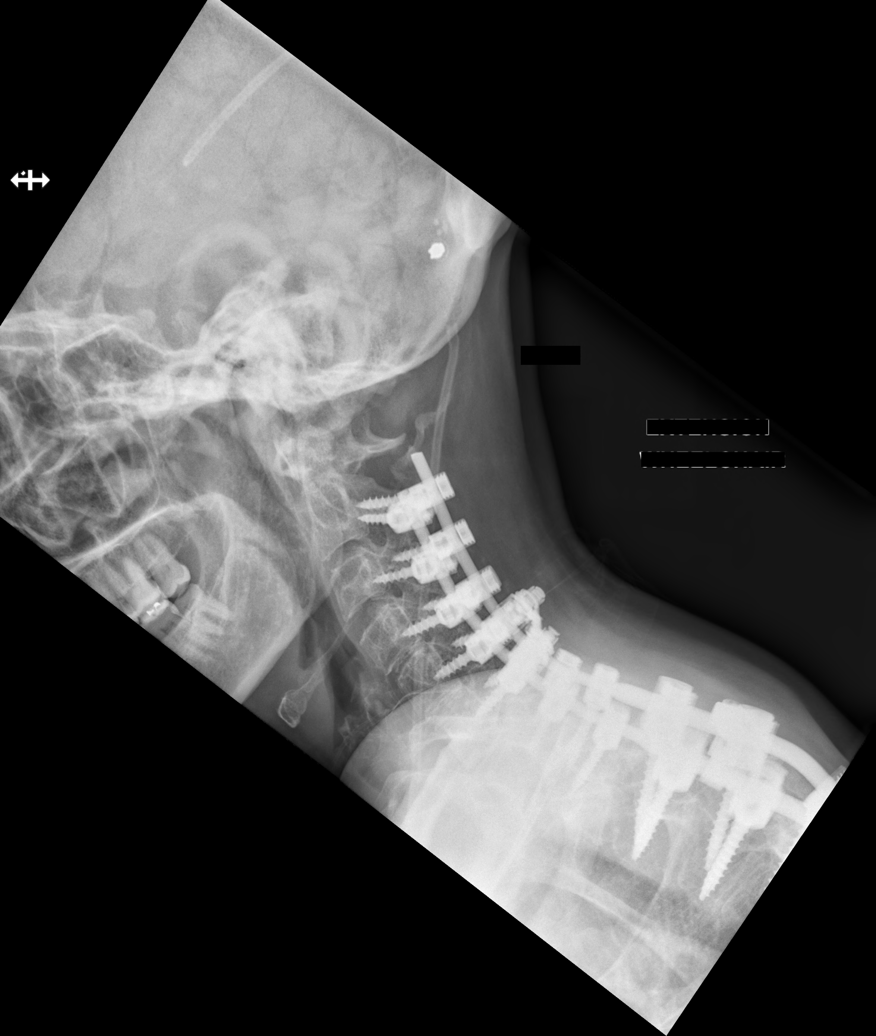
[im 4/5]
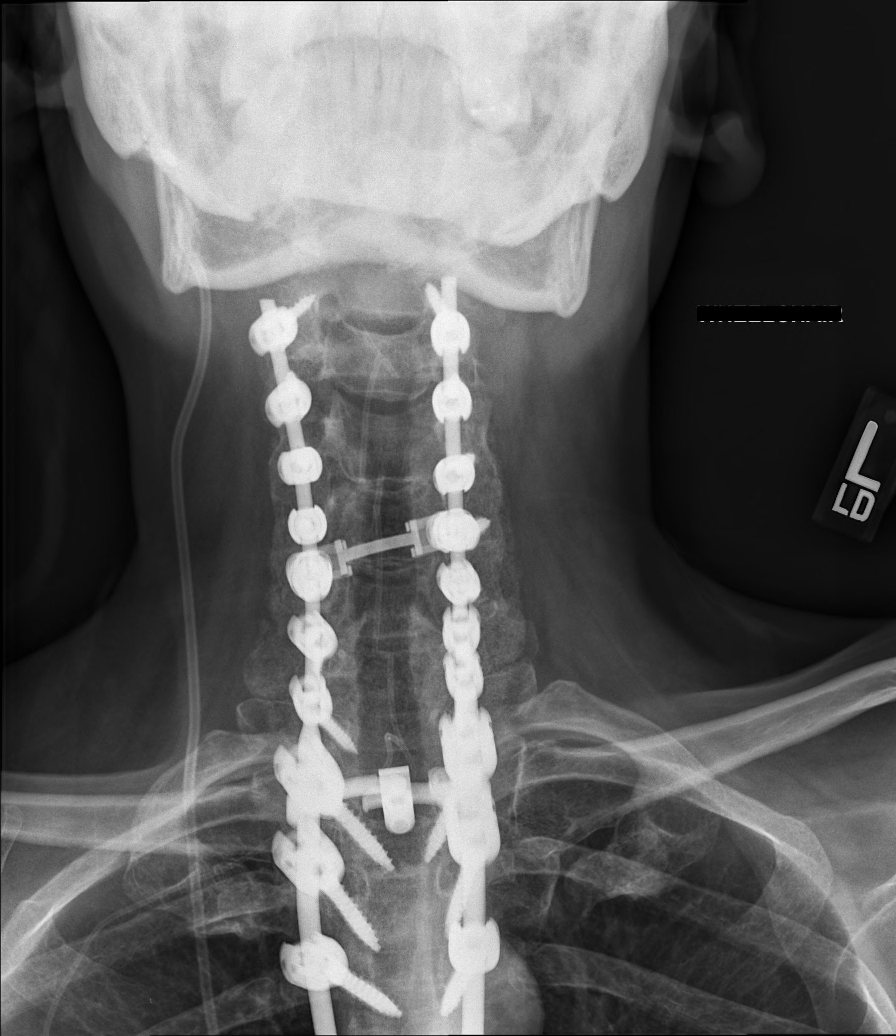
[im 5/5]
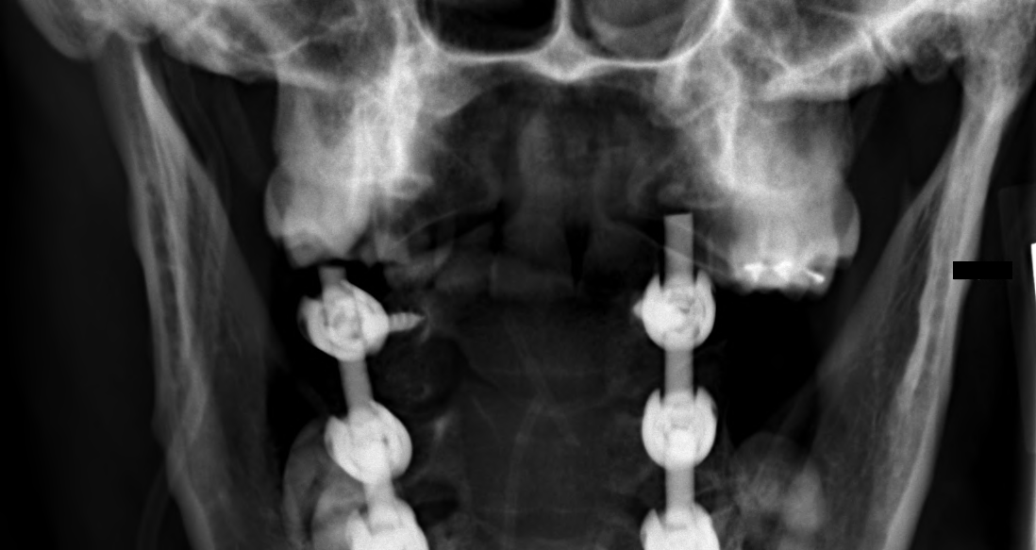

[5 of 5 positions shown; findings below may reference images not displayed]

TECHNIQUE AND FINDINGS:  5 views of the cervical spine were obtained including flexion and extension lateral views. The patient is status post posterior fusion from C2 through at least T4 with transpedicle screws and fixation rods. There is no lucency around the hardware. There is normal height and alignment to the vertebral bodies. Disc spaces are well preserved. A ventricular shunt is noted. There may be an epidural or intraspinal catheter also in place. Prevertebral soft tissues are normal. Dens is intact.
IMPRESSION: Expected postsurgical changes.

## 2955-01-28 DEATH — deceased
